# Patient Record
Sex: Female | Born: 2000
Health system: Southern US, Community
[De-identification: ages and names within clinical notes are randomized; demographics above are authoritative.]

## PROBLEM LIST (undated history)

## (undated) DIAGNOSIS — J302 Other seasonal allergic rhinitis: Secondary | ICD-10-CM

## (undated) DIAGNOSIS — E162 Hypoglycemia, unspecified: Secondary | ICD-10-CM

## (undated) HISTORY — PX: TONSILLECTOMY AND ADENOIDECTOMY: SUR1326

---
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
## 2022-04-11 ENCOUNTER — Ambulatory Visit (INDEPENDENT_AMBULATORY_CARE_PROVIDER_SITE_OTHER): Payer: No Typology Code available for payment source | Admitting: Physician Assistant

## 2022-04-11 ENCOUNTER — Encounter: Payer: Self-pay | Admitting: Physician Assistant

## 2022-04-11 VITALS — BP 120/83 | HR 83 | Ht 65.0 in | Wt 208.0 lb

## 2022-04-11 DIAGNOSIS — G44039 Episodic paroxysmal hemicrania, not intractable: Secondary | ICD-10-CM

## 2022-04-11 DIAGNOSIS — H532 Diplopia: Secondary | ICD-10-CM

## 2022-04-11 DIAGNOSIS — R5383 Other fatigue: Secondary | ICD-10-CM

## 2022-04-11 DIAGNOSIS — R519 Headache, unspecified: Secondary | ICD-10-CM | POA: Insufficient documentation

## 2022-04-11 DIAGNOSIS — Z23 Encounter for immunization: Secondary | ICD-10-CM | POA: Diagnosis not present

## 2022-04-11 DIAGNOSIS — H4710 Unspecified papilledema: Secondary | ICD-10-CM | POA: Diagnosis not present

## 2022-04-11 DIAGNOSIS — G932 Benign intracranial hypertension: Secondary | ICD-10-CM | POA: Diagnosis not present

## 2022-04-11 DIAGNOSIS — H9313 Tinnitus, bilateral: Secondary | ICD-10-CM | POA: Insufficient documentation

## 2022-04-11 LAB — CBC WITH DIFFERENTIAL/PLATELET
Basophils Absolute: 28 cells/uL (ref 0–200)
Eosinophils Absolute: 28 cells/uL (ref 15–500)
Eosinophils Relative: 0.4 %
HCT: 41 % (ref 35.0–45.0)
Lymphs Abs: 1746 cells/uL (ref 850–3900)
MPV: 9.8 fL (ref 7.5–12.5)
Neutro Abs: 4637 cells/uL (ref 1500–7800)
Neutrophils Relative %: 67.2 %
RBC: 4.62 10*6/uL (ref 3.80–5.10)
WBC: 6.9 10*3/uL (ref 3.8–10.8)

## 2022-04-11 MED ORDER — ACETAZOLAMIDE ER 500 MG PO CP12: 500.0000 mg | ORAL_CAPSULE | Freq: Two times a day (BID) | ORAL | 0 refills | Status: DC

## 2022-04-11 NOTE — Progress Notes (Signed)
New Patient Office Visit  Subjective    Patient ID: Alison Holden, female    DOB: April 26, 2001  Age: 21 y.o. MRN: [id number]  CC:  Chief Complaint  Patient presents with   Eye Pain    HPI Alison Holden presents to establish care. She is accompanied by her mother. 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 Swelling. She had had pulsatile tinnitus, frequent headaches since middle school. She has recently had some double vision intermittently. [facility] and MRI were ordered but patient never heard anything. She is here today to get MRI reordered. No new symptoms. She has hx of fatigue and taking iron supplements. She does admit to being tired a lot.   Mother does not want patient to have to have LP. She is interested in trial of diuretic.  Outpatient Encounter Medications as of 04/11/2022  Medication Sig   acetaZOLAMIDE ER (DIAMOX) 500 MG capsule Take 1 capsule (500 mg total) by mouth 2 (two) times daily.   Ascorbic Acid (VITAMIN C) 100 MG tablet Take 100 mg by mouth daily.   ferrous sulfate 325 (65 FE) MG tablet Take 325 mg by mouth daily with breakfast.   magnesium 30 MG tablet Take 30 mg by mouth 2 (two) times daily.   dicyclomine (BENTYL) 20 MG tablet Take 1 tablet (20 mg total) by mouth 3 (three) times daily as needed for spasms. (Patient not taking: Reported on 04/11/2022)   No facility-administered encounter medications on file as of 04/11/2022.    Past Medical History:  Diagnosis Date   Hypoglycemia    Seasonal allergies     Past Surgical History:  Procedure Laterality Date   TONSILLECTOMY AND ADENOIDECTOMY      Family History  Problem Relation Age of Onset   Cancer Other        colon    Social History   Socioeconomic History   Marital status: Single    Spouse name: Not on file   Number of children: Not on file   Years of education: Not on file    Highest education level: Not on file  Occupational History   Not on file  Tobacco Use   Smoking status: Never   Smokeless tobacco: Never  Substance and Sexual Activity   Alcohol use: No   Drug use: No   Sexual activity: Not on file  Other Topics Concern   Not on file  Social History Narrative   Not on file   Social Determinants of Health   Financial Resource Strain: Not on file  Food Insecurity: Not on file  Transportation Needs: Not on file  Physical Activity: Not on file  Stress: Not on file  Social Connections: Not on file  Intimate Partner Violence: Not on file    ROS See HPI.      Objective     Physical Exam Vitals reviewed.  Constitutional:      Appearance: Normal appearance. She is obese.  HENT:     Head: Normocephalic.     Right Ear: Tympanic membrane and external ear normal. There is no impacted cerumen.     Left Ear: Tympanic membrane, ear canal and external ear normal. There is no impacted cerumen.     Nose: Nose normal. No congestion.     Mouth/Throat:     Mouth: Mucous membranes are moist.     Pharynx: No posterior oropharyngeal  erythema.  Eyes:     General:        Right eye: No discharge.        Left eye: No discharge.     Extraocular Movements: Extraocular movements intact.     Conjunctiva/sclera: Conjunctivae normal.     Pupils: Pupils are equal, round, and reactive to light.     Comments: Large optic disc, bilaterally.   Neck:     Vascular: No carotid bruit.  Cardiovascular:     Rate and Rhythm: Normal rate.  Pulmonary:     Effort: Pulmonary effort is normal.     Breath sounds: Normal breath sounds.  Musculoskeletal:     Cervical back: Normal range of motion and neck supple. No tenderness.     Right lower leg: No edema.     Left lower leg: No edema.  Lymphadenopathy:     Cervical: No cervical adenopathy.  Neurological:     General: No focal deficit present.     Mental Status: She is alert and oriented to person, place, and time.   Psychiatric:        Mood and Affect: Mood normal.       Assessment & Plan:  Marland KitchenMarland KitchenCerissa was seen today for eye pain.  Diagnoses and all orders for this visit:  IIH (idiopathic intracranial hypertension) -     acetaZOLAMIDE ER (DIAMOX) 500 MG capsule; Take 1 capsule (500 mg total) by mouth 2 (two) times daily. -     COMPLETE METABOLIC PANEL WITH GFR -     CBC w/Diff/Platelet -     Fe+TIBC+Fer -     TSH -     VITAMIN D 25 Hydroxy (Vit-D Deficiency, Fractures) -     B12 and Folate Panel -     MR MRV HEAD WO CM; Future  Optic disc edema -     acetaZOLAMIDE ER (DIAMOX) 500 MG capsule; Take 1 capsule (500 mg total) by mouth 2 (two) times daily. -     COMPLETE METABOLIC PANEL WITH GFR -     CBC w/Diff/Platelet -     Fe+TIBC+Fer -     TSH -     VITAMIN D 25 Hydroxy (Vit-D Deficiency, Fractures) -     B12 and Folate Panel -     MR MRV HEAD WO CM; Future  No energy -     COMPLETE METABOLIC PANEL WITH GFR -     CBC w/Diff/Platelet -     Fe+TIBC+Fer -     TSH -     VITAMIN D 25 Hydroxy (Vit-D Deficiency, Fractures) -     B12 and Folate Panel  Frequent headaches -     acetaZOLAMIDE ER (DIAMOX) 500 MG capsule; Take 1 capsule (500 mg total) by mouth 2 (two) times daily. -     COMPLETE METABOLIC PANEL WITH GFR -     CBC w/Diff/Platelet -     Fe+TIBC+Fer -     TSH -     VITAMIN D 25 Hydroxy (Vit-D Deficiency, Fractures) -     B12 and Folate Panel -     MR MRV HEAD WO CM; Future  Tinnitus of both ears -     acetaZOLAMIDE ER (DIAMOX) 500 MG capsule; Take 1 capsule (500 mg total) by mouth 2 (two) times daily. -     COMPLETE METABOLIC PANEL WITH GFR -     CBC w/Diff/Platelet -     Fe+TIBC+Fer -     TSH -  VITAMIN D 25 Hydroxy (Vit-D Deficiency, Fractures) -     B12 and Folate Panel -     MR MRV HEAD WO CM; Future  Episodic paroxysmal hemicrania, not intractable -     acetaZOLAMIDE ER (DIAMOX) 500 MG capsule; Take 1 capsule (500 mg total) by mouth 2 (two) times daily. -      MR BRAIN W WO CONTRAST; Future -     MR MRV HEAD WO CM; Future  Double vision -     acetaZOLAMIDE ER (DIAMOX) 500 MG capsule; Take 1 capsule (500 mg total) by mouth 2 (two) times daily. -     MR BRAIN W WO CONTRAST; Future -     MR MRV HEAD WO CM; Future  Need for Tdap vaccination -     Tdap vaccine greater than or equal to 7yo IM  Concern for IIH with headaches, pulsitile tinnitus, double vision, optic disc swelling MRI and MRV ordered Diamox was sent to pharmacy for trial Follow up in 1 month Screening labs were ordered today     Return in about 4 weeks (around 05/09/2022).   Tandy Gaw, PA-C

## 2022-04-11 NOTE — Patient Instructions (Signed)
Idiopathic Intracranial Hypertension  Idiopathic intracranial hypertension (IIH) is a condition that increases pressure around the brain. The fluid that surrounds the brain and spinal cord (cerebrospinal fluid, or CSF) increases and causes the pressure. Idiopathic means that the cause of this condition is not known. IIH affects the brain and spinal cord (neurological disorder). If this condition is not treated, it can cause vision loss or blindness. What are the causes? The cause of this condition is not known. What increases the risk? The following factors may make you more likely to develop this condition: Being very overweight (obese). Being a female between the ages of 63 and 70 years old, who has not gone through menopause. Taking certain medicines, such as birth control or steroids. What are the signs or symptoms? Symptoms of this condition include: Headaches. This is the most common symptom. Brief episodes of total blindness. Double vision, blurred vision, or poor side (peripheral) vision. Pain in the shoulders or neck. Nausea and vomiting. A sound like rushing water or a pulsing sound within the ears (pulsatile tinnitus), or ringing in the ears. How is this diagnosed? This condition may be diagnosed based on: Your symptoms and medical history. Imaging tests of the brain, such as: CT scan. MRI. Magnetic resonance venogram (MRV) to check the veins. Diagnostic lumbar puncture. This is a procedure to remove and examine a sample of cerebrospinal fluid. This procedure can determine whether too much fluid may be causing IIH. A thorough eye exam to check for swelling or nerve damage in the eyes. How is this treated? Treatment for this condition depends on the symptoms. The goal of treatment is to decrease the pressure around your brain. Common treatments include: Weight loss through healthy eating, salt restriction, and exercise, if you are overweight. Medicines to decrease the  production of spinal fluid and lower the pressure within your skull. Medicines to prevent or treat headaches. Other treatments may include: Surgery to place drains (shunts) in your brain for removing excess fluid. Lumbar puncture to remove excess cerebrospinal fluid. Follow these instructions at home: If you are overweight or obese, work with your health care provider to lose weight. Take over-the-counter and prescription medicines only as told by your health care provider. Ask your health care provider if the medicine prescribed to you requires you to avoid driving or using machinery. Do not use any products that contain nicotine or tobacco, such as cigarettes, e-cigarettes, and chewing tobacco. If you need help quitting, ask your health care provider. Keep all follow-up visits as told by your health care provider. This is important. Contact a health care provider if: You have changes in your vision, such as: Double vision. Blurred vision. Poor peripheral vision. Get help right away if: You have any of the following symptoms and they get worse or do not get better: Headaches. Nausea. Vomiting. Sudden trouble seeing. Summary Idiopathic intracranial hypertension (IIH) is a condition that increases pressure around the brain. The cause is not known (is idiopathic). The most common symptom of IIH is headaches. Vision changes, pain in the shoulders or neck, nausea, and vomiting may also occur. Treatment for this condition depends on your symptoms. The goal of treatment is to decrease the pressure around your brain. If you are overweight or obese, work with your health care provider to lose weight. Take over-the-counter and prescription medicines only as told by your health care provider. This information is not intended to replace advice given to you by your health care provider. Make sure you discuss  any questions you have with your health care provider. Document Revised: 10/05/2019 Document  Reviewed: 10/05/2019 Elsevier Patient Education  2023 Elsevier Inc.  

## 2022-04-12 ENCOUNTER — Encounter: Payer: Self-pay | Admitting: Physician Assistant

## 2022-04-12 DIAGNOSIS — E559 Vitamin D deficiency, unspecified: Secondary | ICD-10-CM | POA: Insufficient documentation

## 2022-04-12 DIAGNOSIS — D509 Iron deficiency anemia, unspecified: Secondary | ICD-10-CM | POA: Insufficient documentation

## 2022-04-12 LAB — COMPLETE METABOLIC PANEL WITH GFR
AG Ratio: 1.8 (calc) (ref 1.0–2.5)
ALT: 14 U/L (ref 6–29)
AST: 14 U/L (ref 10–30)
Albumin: 4.7 g/dL (ref 3.6–5.1)
Alkaline phosphatase (APISO): 90 U/L (ref 31–125)
BUN: 12 mg/dL (ref 7–25)
CO2: 26 mmol/L (ref 20–32)
Calcium: 9.8 mg/dL (ref 8.6–10.2)
Chloride: 104 mmol/L (ref 98–110)
Creat: 0.76 mg/dL (ref 0.50–0.96)
Globulin: 2.6 g/dL (calc) (ref 1.9–3.7)
Glucose, Bld: 80 mg/dL (ref 65–139)
Potassium: 4.2 mmol/L (ref 3.5–5.3)
Sodium: 140 mmol/L (ref 135–146)
Total Bilirubin: 0.4 mg/dL (ref 0.2–1.2)
Total Protein: 7.3 g/dL (ref 6.1–8.1)
eGFR: 114 mL/min/{1.73_m2} (ref >=60)

## 2022-04-12 LAB — CBC WITH DIFFERENTIAL/PLATELET
Absolute Monocytes: 462 cells/uL (ref 200–950)
Basophils Relative: 0.4 %
Hemoglobin: 13.6 g/dL (ref 11.7–15.5)
MCH: 29.4 pg (ref 27.0–33.0)
MCHC: 33.2 g/dL (ref 32.0–36.0)
MCV: 88.7 fL (ref 80.0–100.0)
Monocytes Relative: 6.7 %
Platelets: 248 10*3/uL (ref 140–400)
RDW: 12.9 % (ref 11.0–15.0)
Total Lymphocyte: 25.3 %

## 2022-04-12 LAB — IRON,TIBC AND FERRITIN PANEL
%SAT: 17 % (calc) (ref 16–45)
Ferritin: 25 ng/mL (ref 16–154)
Iron: 67 ug/dL (ref 40–190)
TIBC: 398 mcg/dL (calc) (ref 250–450)

## 2022-04-12 LAB — B12 AND FOLATE PANEL
Folate: 16.5 ng/mL
Vitamin B-12: 336 pg/mL (ref 200–1100)

## 2022-04-12 LAB — VITAMIN D 25 HYDROXY (VIT D DEFICIENCY, FRACTURES): Vit D, 25-Hydroxy: 28 ng/mL — ABNORMAL LOW (ref 30–100)

## 2022-04-12 LAB — TSH: TSH: 1.92 mIU/L

## 2022-04-12 NOTE — Progress Notes (Signed)
[place],   Vitamin D is low. Make sure taking 2000 units of D3 a day.  Kidney, liver, glucose look great.  WBC and hemoglobin look great.  Thyroid looks great.   Iron looks ok.  Iron stores a little low.  Continue iron supplement as is but take with vitamin C to help you better absorb and eat iron rich foods.

## 2022-04-30 ENCOUNTER — Ambulatory Visit (HOSPITAL_BASED_OUTPATIENT_CLINIC_OR_DEPARTMENT_OTHER): Payer: No Typology Code available for payment source

## 2022-04-30 ENCOUNTER — Ambulatory Visit (HOSPITAL_BASED_OUTPATIENT_CLINIC_OR_DEPARTMENT_OTHER)
Admission: RE | Admit: 2022-04-30 | Discharge: 2022-04-30 | Disposition: A | Discharge status: Home / Self Care | Payer: No Typology Code available for payment source | Source: Ambulatory Visit | Attending: Physician Assistant | Admitting: Physician Assistant

## 2022-04-30 DIAGNOSIS — H532 Diplopia: Secondary | ICD-10-CM

## 2022-04-30 DIAGNOSIS — G44039 Episodic paroxysmal hemicrania, not intractable: Secondary | ICD-10-CM | POA: Diagnosis present

## 2022-04-30 MED ORDER — GADOBUTROL 1 MMOL/ML IV SOLN
7.5000 mL | Freq: Once | INTRAVENOUS | Status: AC | PRN
  Administered 2022-04-30: 7.5 mL via INTRAVENOUS

## 2022-05-11 ENCOUNTER — Ambulatory Visit (INDEPENDENT_AMBULATORY_CARE_PROVIDER_SITE_OTHER): Payer: No Typology Code available for payment source | Admitting: Physician Assistant

## 2022-05-11 ENCOUNTER — Encounter: Payer: Self-pay | Admitting: Physician Assistant

## 2022-05-11 VITALS — BP 106/68 | HR 70 | Ht 65.0 in | Wt 210.0 lb

## 2022-05-11 DIAGNOSIS — H4710 Unspecified papilledema: Secondary | ICD-10-CM | POA: Diagnosis not present

## 2022-05-11 DIAGNOSIS — G44039 Episodic paroxysmal hemicrania, not intractable: Secondary | ICD-10-CM

## 2022-05-11 DIAGNOSIS — H9313 Tinnitus, bilateral: Secondary | ICD-10-CM | POA: Diagnosis not present

## 2022-05-11 MED ORDER — FLUTICASONE PROPIONATE 50 MCG/ACT NA SUSP: 2.0000 | Freq: Every day | NASAL | 0 refills | Status: AC

## 2022-05-11 NOTE — Progress Notes (Signed)
Established Patient Office Visit  Subjective   Patient ID: Alison Holden, female    DOB: 27-Jun-2001  Age: 21 y.o. MRN: [id number]  Chief Complaint  Patient presents with   Follow-up    HPI Pt is a 21 yo female who presents to the clinic to follow up on optic disc edema found on routine exam and accompanied by frequent headaches and tinnitus. The suspicion was for IIH. She had MRI of brain and no concerns or findings. No recent vision changes. Her tinnitus has been better for the last 2 weeks and not noticed it. Her headaches are around her cycle and respond to excedrin migraine. She would still like to start diuretic. She is accompanied by mother.   .. Active Ambulatory Problems    Diagnosis Date Noted   Annual physical exam 08/22/2016   Irritable bowel syndrome with diarrhea 08/22/2016   Double vision 04/11/2022   Episodic paroxysmal hemicrania, not intractable 04/11/2022   Tinnitus of both ears 04/11/2022   Frequent headaches 04/11/2022   No energy 04/11/2022   Optic disc edema 04/11/2022   IIH (idiopathic intracranial hypertension) 04/11/2022   IDA (iron deficiency anemia) 04/12/2022   Vitamin D insufficiency 04/12/2022   Resolved Ambulatory Problems    Diagnosis Date Noted   No Resolved Ambulatory Problems   Past Medical History:  Diagnosis Date   Hypoglycemia    Seasonal allergies      ROS See HPI.    Objective:     BP 106/68 Comment: left arm manually  Pulse 70   Ht 5\' 5"  (1.651 m)   Wt 210 lb (95.3 kg)   SpO2 98%   BMI 34.95 kg/m  BP Readings from Last 3 Encounters:  05/11/22 106/68  04/11/22 120/83  08/22/16 (!) 128/80 (96 %, Z = 1.75 /  93 %, Z = 1.48)*   *BP percentiles are based on the 2017 AAP Clinical Practice Guideline for girls      Physical Exam Constitutional:      Appearance: Normal appearance. She is obese.  HENT:     Head: Normocephalic.     Right Ear: Tympanic membrane normal.     Left Ear: Tympanic membrane normal.      Nose: Nose normal.     Mouth/Throat:     Mouth: Mucous membranes are moist.  Eyes:     Conjunctiva/sclera: Conjunctivae normal.  Cardiovascular:     Rate and Rhythm: Normal rate and regular rhythm.     Pulses: Normal pulses.     Heart sounds: Normal heart sounds.  Pulmonary:     Effort: Pulmonary effort is normal.     Breath sounds: Normal breath sounds.  Musculoskeletal:     Right lower leg: No edema.     Left lower leg: No edema.  Neurological:     General: No focal deficit present.     Mental Status: She is alert and oriented to person, place, and time.  Psychiatric:        Mood and Affect: Mood normal.         Assessment & Plan:  2018Marland KitchenAzya was seen today for follow-up.  Diagnoses and all orders for this visit:  Optic disc edema  Tinnitus of both ears -     fluticasone (FLONASE) 50 MCG/ACT nasal spray; Place 2 sprays into both nostrils daily.  Episodic paroxysmal hemicrania, not intractable    Pt will scheduled a CPE and Pap before she goes to school.   Consider adding anti-histamine and flonase for  tinnitus and any allergy symptoms  Headaches around cycle about 2 a month and respond to Excedrin migraine   Ok to start diamox 1 tablet every morning but needs to really watch her BP since on the low side.   Needs follow up with opthalmology to review MRI and recheck optic cdisc swelling.     Tandy Gaw, PA-C

## 2022-06-14 ENCOUNTER — Encounter: Payer: Self-pay | Admitting: Physician Assistant

## 2022-06-14 ENCOUNTER — Other Ambulatory Visit (HOSPITAL_COMMUNITY)
Admission: RE | Admit: 2022-06-14 | Discharge: 2022-06-14 | Disposition: A | Discharge status: Home / Self Care | Payer: No Typology Code available for payment source | Source: Ambulatory Visit | Attending: Physician Assistant | Admitting: Physician Assistant

## 2022-06-14 ENCOUNTER — Ambulatory Visit (INDEPENDENT_AMBULATORY_CARE_PROVIDER_SITE_OTHER): Payer: No Typology Code available for payment source | Admitting: Physician Assistant

## 2022-06-14 VITALS — BP 123/63 | HR 90 | Ht 65.0 in | Wt 212.0 lb

## 2022-06-14 DIAGNOSIS — Z0000 Encounter for general adult medical examination without abnormal findings: Secondary | ICD-10-CM | POA: Diagnosis not present

## 2022-06-14 DIAGNOSIS — Z124 Encounter for screening for malignant neoplasm of cervix: Secondary | ICD-10-CM

## 2022-06-14 DIAGNOSIS — E6609 Other obesity due to excess calories: Secondary | ICD-10-CM | POA: Insufficient documentation

## 2022-06-14 DIAGNOSIS — L680 Hirsutism: Secondary | ICD-10-CM

## 2022-06-14 DIAGNOSIS — R519 Headache, unspecified: Secondary | ICD-10-CM

## 2022-06-14 DIAGNOSIS — Z6835 Body mass index (BMI) 35.0-35.9, adult: Secondary | ICD-10-CM

## 2022-06-14 DIAGNOSIS — H4710 Unspecified papilledema: Secondary | ICD-10-CM

## 2022-06-14 MED ORDER — ACETAZOLAMIDE 125 MG PO TABS: 125.0000 mg | ORAL_TABLET | Freq: Two times a day (BID) | ORAL | 0 refills | Status: DC

## 2022-06-14 NOTE — Patient Instructions (Addendum)
Ok for trial of lower dose diuretic.  Get labs.    Health Maintenance, Female Adopting a healthy lifestyle and getting preventive care are important in promoting health and wellness. Ask your health care provider about: The right schedule for you to have regular tests and exams. Things you can do on your own to prevent diseases and keep yourself healthy. What should I know about diet, weight, and exercise? Eat a healthy diet  Eat a diet that includes plenty of vegetables, fruits, low-fat dairy products, and lean protein. Do not eat a lot of foods that are high in solid fats, added sugars, or sodium. Maintain a healthy weight Body mass index (BMI) is used to identify weight problems. It estimates body fat based on height and weight. Your health care provider can help determine your BMI and help you achieve or maintain a healthy weight. Get regular exercise Get regular exercise. This is one of the most important things you can do for your health. Most adults should: Exercise for at least 150 minutes each week. The exercise should increase your heart rate and make you sweat (moderate-intensity exercise). Do strengthening exercises at least twice a week. This is in addition to the moderate-intensity exercise. Spend less time sitting. Even light physical activity can be beneficial. Watch cholesterol and blood lipids Have your blood tested for lipids and cholesterol at 22 years of age, then have this test every 5 years. Have your cholesterol levels checked more often if: Your lipid or cholesterol levels are high. You are older than 22 years of age. You are at high risk for heart disease. What should I know about cancer screening? Depending on your health history and family history, you may need to have cancer screening at various ages. This may include screening for: Breast cancer. Cervical cancer. Colorectal cancer. Skin cancer. Lung cancer. What should I know about heart disease,  diabetes, and high blood pressure? Blood pressure and heart disease High blood pressure causes heart disease and increases the risk of stroke. This is more likely to develop in people who have high blood pressure readings or are overweight. Have your blood pressure checked: Every 3-5 years if you are 50-18 years of age. Every year if you are 51 years old or older. Diabetes Have regular diabetes screenings. This checks your fasting blood sugar level. Have the screening done: Once every three years after age 38 if you are at a normal weight and have a low risk for diabetes. More often and at a younger age if you are overweight or have a high risk for diabetes. What should I know about preventing infection? Hepatitis B If you have a higher risk for hepatitis B, you should be screened for this virus. Talk with your health care provider to find out if you are at risk for hepatitis B infection. Hepatitis C Testing is recommended for: Everyone born from [birth year] through 1966. Anyone with known risk factors for hepatitis C. Sexually transmitted infections (STIs) Get screened for STIs, including gonorrhea and chlamydia, if: You are sexually active and are younger than 22 years of age. You are older than 22 years of age and your health care provider tells you that you are at risk for this type of infection. Your sexual activity has changed since you were last screened, and you are at increased risk for chlamydia or gonorrhea. Ask your health care provider if you are at risk. Ask your health care provider about whether you are at high risk for HIV.  Your health care provider may recommend a prescription medicine to help prevent HIV infection. If you choose to take medicine to prevent HIV, you should first get tested for HIV. You should then be tested every 3 months for as long as you are taking the medicine. Pregnancy If you are about to stop having your period (premenopausal) and you may become pregnant,  seek counseling before you get pregnant. Take 400 to 800 micrograms (mcg) of folic acid every day if you become pregnant. Ask for birth control (contraception) if you want to prevent pregnancy. Osteoporosis and menopause Osteoporosis is a disease in which the bones lose minerals and strength with aging. This can result in bone fractures. If you are 25 years old or older, or if you are at risk for osteoporosis and fractures, ask your health care provider if you should: Be screened for bone loss. Take a calcium or vitamin D supplement to lower your risk of fractures. Be given hormone replacement therapy (HRT) to treat symptoms of menopause. Follow these instructions at home: Alcohol use Do not drink alcohol if: Your health care provider tells you not to drink. You are pregnant, may be pregnant, or are planning to become pregnant. If you drink alcohol: Limit how much you have to: 0-1 drink a day. Know how much alcohol is in your drink. In the [place] one drink equals one 12 oz bottle of beer (355 mL), one 5 oz glass of wine (148 mL), or one 1 oz glass of hard liquor (44 mL). Lifestyle Do not use any products that contain nicotine or tobacco. These products include cigarettes, chewing tobacco, and vaping devices, such as e-cigarettes. If you need help quitting, ask your health care provider. Do not use street drugs. Do not share needles. Ask your health care provider for help if you need support or information about quitting drugs. General instructions Schedule regular health, dental, and eye exams. Stay current with your vaccines. Tell your health care provider if: You often feel depressed. You have ever been abused or do not feel safe at home. Summary Adopting a healthy lifestyle and getting preventive care are important in promoting health and wellness. Follow your health care provider's instructions about healthy diet, exercising, and getting tested or screened for diseases. Follow your  health care provider's instructions on monitoring your cholesterol and blood pressure. This information is not intended to replace advice given to you by your health care provider. Make sure you discuss any questions you have with your health care provider. Document Revised: 03/15/2021 Document Reviewed: 03/15/2021 Elsevier Patient Education  [place].

## 2022-06-14 NOTE — Progress Notes (Signed)
Complete physical exam  Patient: Alison Holden   DOB: August 15, 2001   20 y.o. Female  MRN: [id number]  Subjective:    Chief Complaint  Patient presents with   Annual Exam    Gracilyn Gunia is a 21 y.o. female who presents today for a complete physical exam. She reports consuming a general diet. The patient does not participate in regular exercise at present. She generally feels well. She reports sleeping well. She does not have additional problems to discuss today.     Most recent depression screenings:    04/11/2022    9:04 AM  PHQ 2/9 Scores  PHQ - 2 Score 0  PHQ- 9 Score 0    Vision:Within last year and Dental: No current dental problems and Current dental problems  Patient Active Problem List   Diagnosis Date Noted   Hirsutism 06/14/2022   Class 2 obesity due to excess calories without serious comorbidity with body mass index (BMI) of 35.0 to 35.9 in adult 06/14/2022   IDA (iron deficiency anemia) 04/12/2022   Vitamin D insufficiency 04/12/2022   Double vision 04/11/2022   Episodic paroxysmal hemicrania, not intractable 04/11/2022   Tinnitus of both ears 04/11/2022   Frequent headaches 04/11/2022   No energy 04/11/2022   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   ferrous sulfate 325 (65 FE) MG tablet Take 325 mg by mouth daily with breakfast.   fluticasone (FLONASE) 50 MCG/ACT nasal spray Place 2 sprays into both nostrils daily.   Magnesium  250 MG TABS Take 250 mg by mouth daily.   [DISCONTINUED] acetaZOLAMIDE ER (DIAMOX) 500 MG capsule Take 1 capsule (500 mg total) by mouth 2 (two) times daily. (Patient not taking: Reported on 05/11/2022)   No facility-administered medications prior to visit.    Review of Systems  All other systems reviewed and are negative.         Objective:     BP 123/63   Pulse 90   Ht 5\' 5"  (1.651 m)   Wt 212 lb (96.2 kg)   SpO2 98%   BMI 35.28 kg/m  BP Readings from Last 3 Encounters:  06/14/22 123/63  05/11/22 106/68  04/11/22 120/83   Wt Readings from Last 3 Encounters:  06/14/22 212 lb (96.2 kg)  05/11/22 210 lb (95.3 kg)  04/11/22 208 lb (94.3 kg)      Physical Exam  BP 123/63   Pulse 90   Ht 5\' 5"  (1.651 m)   Wt 212 lb (96.2 kg)   SpO2 98%   BMI 35.28 kg/m   General Appearance:    Alert, cooperative, no distress, appears stated age  Head:    Normocephalic, without obvious abnormality, atraumatic  Eyes:    PERRL, conjunctiva/corneas clear, EOM's intact, fundi    benign, both eyes  Ears:    Normal TM's and external ear canals, both ears  Nose:  Nares normal, septum midline, mucosa normal, no drainage    or sinus tenderness  Throat:   Lips, mucosa, and tongue normal; teeth and gums normal  Neck:   Supple, symmetrical, trachea midline, no adenopathy;    thyroid:  no enlargement/tenderness/nodules; no carotid   bruit or JVD  Back:     Symmetric, no curvature, ROM normal, no CVA tenderness  Lungs:     Clear to auscultation bilaterally, respirations unlabored  Chest Wall:    No tenderness or deformity   Heart:    Regular rate and rhythm, S1 and S2 normal, no murmur, rub   or gallop  Breast Exam:    No tenderness, masses, or nipple abnormality  Abdomen:     Soft, non-tender, bowel sounds active all four quadrants,    no masses, no organomegaly  Genitalia:    No adnexal tenderness. Discomfort with spectulum and bimanuel exam     Extremities:   Extremities normal,  atraumatic, no cyanosis or edema  Pulses:   2+ and symmetric all extremities  Skin:   Skin color, texture, turgor normal, no rashes or lesions Hair growth on breast and umbilical area  Lymph nodes:   Cervical, supraclavicular, and axillary nodes normal  Neurologic:   CNII-XII intact, normal strength, sensation and reflexes    throughout      Assessment & Plan:    Routine Health Maintenance and Physical Exam  Immunization History  Administered Date(s) Administered   Meningococcal Conjugate 01/22/2018   Tdap 04/11/2022    Health Maintenance  Topic Date Due   PAP SMEAR-Modifier  Never done   INFLUENZA VACCINE  06/07/2022   COVID-19 Vaccine (1) 06/30/2022 (Originally 04/25/2001)   PAP-Cervical Cytology Screening  04/12/2023 (Originally 10/25/2021)   HPV VACCINES (1 - 2-dose series) 04/12/2023 (Originally 10/26/2011)   Hepatitis C Screening  04/12/2023 (Originally 10/25/2018)   HIV Screening  04/12/2023 (Originally 10/26/2015)   TETANUS/TDAP  04/11/2032    Discussed health benefits of physical activity, and encouraged her to engage in regular exercise appropriate for her age and condition.  .. Discussed 150 minutes of exercise a week.  Encouraged vitamin D 1000 units and Calcium 1300mg  or 4 servings of dairy a day.  PHQ no concerns Lipid and hormones ordered for PCOS evaluation Pap done today Pt is not sexually active Discussed vaginismus, HO given Ok to try lower diamox dosage to see if drops BP less  MRI normal Follow up with opthalmology is 1 month for optic disc check Follow up in 1 year     , PA-C

## 2022-06-15 LAB — CYTOLOGY - PAP
Adequacy: ABSENT
Comment: NEGATIVE
Diagnosis: NEGATIVE
High risk HPV: NEGATIVE

## 2022-06-15 NOTE — Progress Notes (Signed)
Negative for HPV. No abnormal cells. Next pap in 3 years.

## 2022-06-22 NOTE — Progress Notes (Signed)
Still waiting for your testosterone.   Prolactin looked good.  Your DHEA was elevated which can be elevated in cases of PCOS.   Cholesterol looks good.  HDL a little low but weight loss and exercise could get this higher and more protective.

## 2022-06-25 LAB — PROLACTIN: Prolactin: 13.1 ng/mL

## 2022-06-25 LAB — LIPID PANEL W/REFLEX DIRECT LDL
Cholesterol: 128 mg/dL (ref <200)
HDL: 43 mg/dL — ABNORMAL LOW (ref >=50)
LDL Cholesterol (Calc): 76 mg/dL (calc)
Non-HDL Cholesterol (Calc): 85 mg/dL (calc) (ref <130)
Total CHOL/HDL Ratio: 3 (calc) (ref <5.0)
Triglycerides: 30 mg/dL (ref <150)

## 2022-06-25 LAB — CP TESTOSTERONE, BIO-FEMALE/CHILDREN
Albumin: 4.4 g/dL (ref 3.6–5.1)
Sex Hormone Binding: 39.1 nmol/L (ref 17–124)
TESTOSTERONE, BIOAVAILABLE: 10.8 ng/dL — ABNORMAL HIGH (ref 0.5–8.5)
Testosterone, Free: 5.4 pg/mL — ABNORMAL HIGH (ref 0.2–5.0)
Testosterone, Total, LC-MS-MS: 52 ng/dL — ABNORMAL HIGH (ref 2–45)

## 2022-06-25 LAB — DHEA-SULFATE: DHEA-SO4: 476 ug/dL — ABNORMAL HIGH (ref 44–286)

## 2022-06-27 ENCOUNTER — Encounter: Payer: Self-pay | Admitting: Physician Assistant

## 2022-06-27 NOTE — Progress Notes (Signed)
Testosterone and DHEA are up and diagnostic for PCOS.   The best way to control and manage PCOS is keeping a healthy weight.  At times starting birth control can also help with the hormonal management and adding metformin can help with the insulin resistance. We could do a virtual and discuss more and see if you would like any medication treatment?

## 2022-06-28 ENCOUNTER — Encounter: Payer: Self-pay | Admitting: Physician Assistant

## 2022-06-28 MED ORDER — METFORMIN HCL ER 500 MG PO TB24: 500.0000 mg | ORAL_TABLET | Freq: Every day | ORAL | 3 refills | Status: DC

## 2022-06-28 NOTE — Addendum Note (Signed)
Addended by: Jomarie Longs on: 06/28/2022 07:28 AM   Modules accepted: Orders

## 2022-07-01 MED ORDER — NORETHINDRONE ACET-ETHINYL EST 1-20 MG-MCG PO TABS: 1.0000 | ORAL_TABLET | Freq: Every day | ORAL | 3 refills | Status: DC

## 2022-08-10 ENCOUNTER — Ambulatory Visit: Payer: No Typology Code available for payment source | Admitting: Physician Assistant

## 2023-01-16 ENCOUNTER — Encounter: Payer: Self-pay | Admitting: Physician Assistant

## 2023-01-16 ENCOUNTER — Other Ambulatory Visit: Payer: Self-pay | Admitting: Physician Assistant

## 2023-01-16 ENCOUNTER — Ambulatory Visit (INDEPENDENT_AMBULATORY_CARE_PROVIDER_SITE_OTHER): Payer: No Typology Code available for payment source | Admitting: Physician Assistant

## 2023-01-16 VITALS — BP 108/64 | HR 88 | Temp 97.9°F | Ht 65.0 in | Wt 210.0 lb

## 2023-01-16 DIAGNOSIS — J0110 Acute frontal sinusitis, unspecified: Secondary | ICD-10-CM

## 2023-01-16 DIAGNOSIS — Z6834 Body mass index (BMI) 34.0-34.9, adult: Secondary | ICD-10-CM

## 2023-01-16 DIAGNOSIS — E282 Polycystic ovarian syndrome: Secondary | ICD-10-CM

## 2023-01-16 DIAGNOSIS — E6609 Other obesity due to excess calories: Secondary | ICD-10-CM | POA: Diagnosis not present

## 2023-01-16 MED ORDER — AMOXICILLIN-POT CLAVULANATE 875-125 MG PO TABS: 1.0000 | ORAL_TABLET | Freq: Two times a day (BID) | ORAL | 0 refills | Status: DC

## 2023-01-16 MED ORDER — METFORMIN HCL ER (MOD) 1000 MG PO TB24: 1000.0000 mg | ORAL_TABLET | Freq: Every day | ORAL | 3 refills | Status: DC

## 2023-01-16 NOTE — Patient Instructions (Signed)

## 2023-01-16 NOTE — Progress Notes (Signed)
Established Patient Office Visit  Subjective   Patient ID: Alison Holden, female    DOB: 02-01-2001  Age: 22 y.o. MRN: [id number]  Chief Complaint  Patient presents with   Follow-up    HPI Pt is a 22 year old female who presents to the clinic with 3 weeks of upper respiratory infection symptoms.  She has been to her [facility] and tested for COVID, strep, flu, RSV and all were negative.  She has used over-the-counter medications such as Tylenol Cold and sinus and Mucinex with some mild relief.  She continues to have a lot of head pressure, ear fullness, sore throat, cough.  She is blowing out green productive sputum.  She denies any fever, chills, body aches, shortness of breath.  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  She has done well on metformin with no side effects.  She does feel like it has helped her lose some weight.  She would like to increase this dose.  She is actively going to taekwondo and managing her diet.  .. 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     Objective:     BP 108/64    Pulse 88   Temp 97.9 F (36.6 C) (Oral)   Ht '5\' 5"'$  (1.651 m)   Wt 210 lb (95.3 kg)   SpO2 97%   BMI 34.95 kg/m  BP Readings from Last 3 Encounters:  01/16/23 108/64  06/14/22 123/63  05/11/22 106/68   Wt Readings from Last 3 Encounters:  01/16/23 210 lb (95.3 kg)  06/14/22 212 lb (96.2 kg)  05/11/22 210 lb (95.3 kg)      Physical Exam Constitutional:      Appearance: Normal appearance. She is obese.  HENT:     Head: Normocephalic.     Right Ear: Tympanic membrane, ear canal and external ear normal. There is no impacted cerumen.     Left Ear: Tympanic membrane, ear canal and external ear normal. There is no impacted cerumen.     Ears:     Comments: Tenderness to palpation over maxillary sinuses     Nose: Congestion present.     Mouth/Throat:     Mouth: Mucous membranes are moist.     Pharynx: Posterior oropharyngeal erythema present. No oropharyngeal exudate.  Eyes:     Extraocular Movements: Extraocular movements intact.     Conjunctiva/sclera: Conjunctivae normal.     Pupils: Pupils are equal, round, and reactive to light.  Neck:     Vascular: No carotid bruit.  Cardiovascular:     Rate and Rhythm: Normal rate and  regular rhythm.  Pulmonary:     Effort: Pulmonary effort is normal.     Breath sounds: Normal breath sounds.  Musculoskeletal:     Cervical back: Neck supple.     Right lower leg: No edema.     Left lower leg: No edema.  Lymphadenopathy:     Cervical: No cervical adenopathy.  Neurological:     General: No focal deficit present.     Mental Status: She is alert.  Psychiatric:        Mood and Affect: Mood normal.       Assessment & Plan:  Marland KitchenMarland KitchenShilyn was seen today for follow-up.  Diagnoses and all orders for this visit:  Acute non-recurrent frontal sinusitis -     amoxicillin-clavulanate (AUGMENTIN) 875-125 MG tablet; Take 1 tablet by mouth 2 (two) times daily.  PCOS (polycystic ovarian syndrome) -     metFORMIN (GLUMETZA) 1000 MG (MOD)  24 hr tablet; Take 1 tablet (1,000 mg total) by mouth daily with breakfast.  Class 1 obesity due to excess calories without serious comorbidity with body mass index (BMI) of 34.0 to 34.9 in adult -     metFORMIN (GLUMETZA) 1000 MG (MOD) 24 hr tablet; Take 1 tablet (1,000 mg total) by mouth daily with breakfast.   3 weeks of viral URI suspect secondary respiratory infection start Augmentin for 10 days Follow up as needed or if symptoms persist or worsen  Increased metformin to '1000mg'$  ER in the morning Continue with exercise at least 150 minutes a week Consider IF 16 to 8.    Iran Planas, PA-C

## 2023-01-17 MED ORDER — METFORMIN HCL ER 500 MG PO TB24: 1000.0000 mg | ORAL_TABLET | Freq: Every day | ORAL | 3 refills | Status: DC

## 2023-04-10 ENCOUNTER — Other Ambulatory Visit: Payer: Self-pay | Admitting: Physician Assistant

## 2023-04-10 DIAGNOSIS — G44039 Episodic paroxysmal hemicrania, not intractable: Secondary | ICD-10-CM

## 2023-04-10 DIAGNOSIS — G932 Benign intracranial hypertension: Secondary | ICD-10-CM

## 2023-04-10 DIAGNOSIS — H532 Diplopia: Secondary | ICD-10-CM

## 2023-04-10 DIAGNOSIS — H9313 Tinnitus, bilateral: Secondary | ICD-10-CM

## 2023-04-10 DIAGNOSIS — R519 Headache, unspecified: Secondary | ICD-10-CM

## 2023-04-10 DIAGNOSIS — H4710 Unspecified papilledema: Secondary | ICD-10-CM

## 2023-08-02 ENCOUNTER — Telehealth: Payer: Self-pay | Admitting: *Deleted

## 2023-08-02 NOTE — Telephone Encounter (Signed)
Calling pt to schedule follow up per LOV in 06/2022.  Pt mother said pt would be available either 10/10 or 10/11.  PCP not available those days.  Pts mother will call back to schedule when pt gets out of class. Please assist in getting this scheduled.

## 2023-11-16 ENCOUNTER — Ambulatory Visit: Payer: No Typology Code available for payment source | Admitting: Family Medicine

## 2023-11-17 ENCOUNTER — Encounter: Payer: Self-pay | Admitting: Physician Assistant

## 2023-11-17 ENCOUNTER — Ambulatory Visit (INDEPENDENT_AMBULATORY_CARE_PROVIDER_SITE_OTHER): Payer: No Typology Code available for payment source | Admitting: Physician Assistant

## 2023-11-17 VITALS — BP 138/82 | HR 85 | Ht 65.0 in | Wt 185.0 lb

## 2023-11-17 DIAGNOSIS — Z6830 Body mass index (BMI) 30.0-30.9, adult: Secondary | ICD-10-CM

## 2023-11-17 DIAGNOSIS — R5383 Other fatigue: Secondary | ICD-10-CM

## 2023-11-17 DIAGNOSIS — E6609 Other obesity due to excess calories: Secondary | ICD-10-CM

## 2023-11-17 DIAGNOSIS — E282 Polycystic ovarian syndrome: Secondary | ICD-10-CM | POA: Diagnosis not present

## 2023-11-17 DIAGNOSIS — E559 Vitamin D deficiency, unspecified: Secondary | ICD-10-CM

## 2023-11-17 DIAGNOSIS — R110 Nausea: Secondary | ICD-10-CM

## 2023-11-17 DIAGNOSIS — D508 Other iron deficiency anemias: Secondary | ICD-10-CM | POA: Diagnosis not present

## 2023-11-17 DIAGNOSIS — E66811 Obesity, class 1: Secondary | ICD-10-CM

## 2023-11-17 MED ORDER — METFORMIN HCL ER 500 MG PO TB24: 1000.0000 mg | ORAL_TABLET | Freq: Every day | ORAL | 3 refills | Status: DC

## 2023-11-17 MED ORDER — ONDANSETRON 8 MG PO TBDP: 8.0000 mg | ORAL_TABLET | Freq: Three times a day (TID) | ORAL | 1 refills | Status: AC | PRN

## 2023-11-17 NOTE — Patient Instructions (Signed)
Nausea, Adult Nausea is feeling like you may vomit. Feeling like you may vomit is usually not serious, but it may be an early sign of a more serious medical problem. Vomiting is when stomach contents forcefully come out of your mouth. If you vomit, or if you are not able to drink enough fluids, you may not have enough water in your body (get dehydrated). If you do not have enough water in your body, you may: Feel tired. Feel thirsty. Have a dry mouth. Have cracked lips. Pee (urinate) less often. Older adults and people who have other diseases or a weak body defense system (immune system) have a higher risk of not having enough water in the body. The main goals of treating this condition are: To relieve your nausea. To ensure your nausea occurs less often. To prevent vomiting and losing too much fluid. Follow these instructions at home: Watch your symptoms for any changes. Tell your doctor about them. Eating and drinking     Take an ORS (oral rehydration solution). This is a drink that is sold at pharmacies and stores. Drink clear fluids in small amounts as you are able. These include: Water. Ice chips. Fruit juice that has water added (diluted fruit juice). Low-calorie sports drinks. Eat bland, easy-to-digest foods in small amounts as you are able, such as: Bananas. Applesauce. Rice. Low-fat (lean) meats. Toast. Crackers. Avoid drinking fluids that have a lot of sugar or caffeine in them. This includes energy drinks, sports drinks, and soda. Avoid alcohol. Avoid spicy or fatty foods. General instructions Take over-the-counter and prescription medicines only as told by your doctor. Rest at home while you get better. Drink enough fluid to keep your pee (urine) pale yellow. Take slow and deep breaths when you feel like you may vomit. Avoid food or things that have strong smells. Wash your hands often with soap and water for at least 20 seconds. If you cannot use soap and water,  use hand sanitizer. Make sure that everyone in your home washes their hands well and often. Keep all follow-up visits. Contact a doctor if: You feel worse. You feel like you may vomit and this lasts for more than 2 days. You vomit. You are not able to drink fluids without vomiting. You have new symptoms. You have a fever. You have a headache. You have muscle cramps. You have a rash. You have pain while peeing. You feel light-headed or dizzy. Get help right away if: You have pain in your chest, neck, arm, or jaw. You feel very weak or you faint. You have vomit that is bright red or looks like coffee grounds. You have bloody or black poop (stools) or poop that looks like tar. You have a very bad headache, a stiff neck, or both. You have very bad pain, cramping, or bloating in your belly (abdomen). You have trouble breathing or you are breathing very quickly. Your heart is beating very quickly. Your skin feels cold and clammy. You feel confused. You have signs of losing too much water in your body, such as: Dark pee, very little pee, or no pee. Cracked lips. Dry mouth. Sunken eyes. Sleepiness. Weakness. These symptoms may be an emergency. Get help right away. Call 911. Do not wait to see if the symptoms will go away. Do not drive yourself to the hospital. Summary Nausea is feeling like you are about vomit. If you vomit, or if you are not able to drink enough fluids, you may not have enough water in   your body (get dehydrated). Eat and drink what your doctor tells you. Take over-the-counter and prescription medicines only as told by your doctor. Contact a doctor right away if your symptoms get worse or you have new symptoms. Keep all follow-up visits. This information is not intended to replace advice given to you by your health care provider. Make sure you discuss any questions you have with your health care provider. Document Revised: 04/30/2021 Document Reviewed:  04/30/2021 Elsevier Patient Education  2024 [employer]  

## 2023-11-17 NOTE — Progress Notes (Signed)
 Established Patient Office Visit  Subjective   Patient ID: Alison Holden, female    DOB: 2001-05-31  Age: 23 y.o. MRN: [id number]  Chief Complaint  Patient presents with   Medical Management of Chronic Issues    Med refill Nausea with metformin  pt reports taking 1,000 mg once daily,pt want to know if its time for labs    HPI Pt is 23 yo female with PCOS, IDA, vitamin D  deficiency who presents to the clinic for follow up.   She is on metformin  2 tablets in the morning for the last 9 months and doing GREAT. She has lost 25lbs. She denies any CP, palpitations, headaches, vision changes. She is exercising and eating better.   She has been nauseated in the morning for the last week with some dry mouth. Denies any fever, chills, SOB, cough, vomiting, diarrhea, constipation or abdominal pain. No reflux issues. No urinary symptoms.  Not taking any new medications or supplements. She is not sexually active.   .. 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 Negative for chills, diaphoresis, fever and weight  loss.  HENT: Negative.    Eyes: Negative.   Respiratory: Negative.    Cardiovascular: Negative.   Gastrointestinal:  Positive for nausea. Negative for abdominal pain, constipation, diarrhea, heartburn and vomiting.  Genitourinary: Negative.   Skin: Negative.  Negative for rash.  Neurological: Negative.   Psychiatric/Behavioral: Negative.        Objective:     BP 138/82   Pulse 85   Ht 5' 5 (1.651 m)   Wt 185 lb (83.9 kg)   SpO2 99%   BMI 30.79 kg/m  BP Readings from Last 3 Encounters:  11/17/23 138/82  01/16/23 108/64  06/14/22 123/63   Wt Readings from Last 3 Encounters:  11/17/23 185 lb (83.9 kg)  01/16/23 210 lb (95.3 kg)  06/14/22 212 lb (96.2 kg)      Physical Exam Constitutional:      Appearance: Normal appearance.  HENT:     Head: Normocephalic.  Cardiovascular:     Rate and Rhythm: Normal rate and regular rhythm.  Pulmonary:     Effort: Pulmonary effort is normal.     Breath sounds: Normal breath sounds.  Abdominal:     General: There is no distension.     Palpations: Abdomen is soft. There is no mass.     Tenderness: There is no abdominal tenderness. There is no guarding or rebound.     Hernia: No hernia is present.  Musculoskeletal:     Cervical back: Normal range of motion and neck supple. No tenderness.  Lymphadenopathy:     Cervical: No cervical adenopathy.  Neurological:     General: No focal deficit present.     Mental Status: She is alert and oriented to person, place, and time.  Psychiatric:        Mood and Affect: Mood normal.          Assessment & Plan:  SABRASABRASaleena was seen today for medical management of chronic issues.  Diagnoses and all orders for this visit:  PCOS (polycystic ovarian syndrome) -     Testosterone  -     VITAMIN D  25 Hydroxy (Vit-D Deficiency, Fractures) -     TSH + free T4 -     Fe+TIBC+Fer -     CBC w/Diff/Platelet -     CMP14+EGFR -     B12 and Folate Panel -     Lipid panel -     metFORMIN   (GLUCOPHAGE -XR) 500 MG 24 hr tablet; Take 2 tablets (1,000 mg total) by mouth daily with breakfast.  Class 1 obesity due to excess calories without serious comorbidity with body mass index (BMI) of 30.0 to 30.9 in adult -     Testosterone  -     VITAMIN D  25 Hydroxy (Vit-D Deficiency, Fractures) -     TSH + free T4 -     Fe+TIBC+Fer -     CBC w/Diff/Platelet -     CMP14+EGFR -     B12 and Folate Panel -     Lipid panel -     metFORMIN  (GLUCOPHAGE -XR) 500 MG 24 hr tablet; Take 2 tablets (1,000 mg total) by mouth daily with breakfast.  Iron deficiency anemia secondary to inadequate dietary iron intake -     Fe+TIBC+Fer -     CBC w/Diff/Platelet  No energy -     Testosterone  -     VITAMIN D  25 Hydroxy (Vit-D Deficiency, Fractures) -     TSH + free T4 -     Fe+TIBC+Fer -     CBC w/Diff/Platelet -     CMP14+EGFR -     B12 and Folate Panel -     Lipid panel  Vitamin D  insufficiency -     VITAMIN D  25 Hydroxy (Vit-D Deficiency, Fractures)  Nausea -     ondansetron  (ZOFRAN -ODT) 8 MG disintegrating tablet; Take 1 tablet (8 mg total) by mouth every 8 (eight) hours as needed.   Recheck fasting labs today Refilled metformin  Follow up in 1 year  Vitals look good GREAT news on weight loss  No cause for nausea Not sexually active No red flags ? Viral or dehydration Zofran  as needed given Follow up if does not resolve or worsen Do not think medication related no recent new meds or changes   Return in about 1 year (around 11/16/2024), or if symptoms worsen or fail to improve.    Avis Tirone, PA-C

## 2023-11-20 NOTE — Progress Notes (Signed)
 Vitamin d  looks better. Continue to take regularly.  Thyroid looks good.  Iron and hemoglobin look good. Kidney, liver, glucose look great.  Cholesterol looks good.  Testosterone  still on the high side. We could start spironolactone  to see if it helps combat some of effects of higher testosterone . Thoughts?

## 2023-11-21 LAB — LIPID PANEL
Chol/HDL Ratio: 3 {ratio} (ref 0.0–4.4)
Cholesterol, Total: 139 mg/dL (ref 100–199)
HDL: 46 mg/dL (ref >39)
LDL Chol Calc (NIH): 83 mg/dL (ref 0–99)
Triglycerides: 43 mg/dL (ref 0–149)
VLDL Cholesterol Cal: 10 mg/dL (ref 5–40)

## 2023-11-21 LAB — TSH+FREE T4
Free T4: 1.31 ng/dL (ref 0.82–1.77)
TSH: 1.36 u[IU]/mL (ref 0.450–4.500)

## 2023-11-21 LAB — CMP14+EGFR
ALT: 10 [IU]/L (ref 0–32)
AST: 14 [IU]/L (ref 0–40)
Albumin: 4.7 g/dL (ref 4.0–5.0)
Alkaline Phosphatase: 84 [IU]/L (ref 44–121)
BUN/Creatinine Ratio: 15 (ref 9–23)
BUN: 12 mg/dL (ref 6–20)
Bilirubin Total: 0.4 mg/dL (ref 0.0–1.2)
CO2: 20 mmol/L (ref 20–29)
Calcium: 9.6 mg/dL (ref 8.7–10.2)
Chloride: 100 mmol/L (ref 96–106)
Creatinine, Ser: 0.81 mg/dL (ref 0.57–1.00)
Globulin, Total: 2.2 g/dL (ref 1.5–4.5)
Glucose: 75 mg/dL (ref 70–99)
Potassium: 4.4 mmol/L (ref 3.5–5.2)
Sodium: 138 mmol/L (ref 134–144)
Total Protein: 6.9 g/dL (ref 6.0–8.5)
eGFR: 105 mL/min/{1.73_m2} (ref >59)

## 2023-11-21 LAB — CBC WITH DIFFERENTIAL/PLATELET
Basophils Absolute: 0 10*3/uL (ref 0.0–0.2)
Basos: 0 %
EOS (ABSOLUTE): 0 10*3/uL (ref 0.0–0.4)
Eos: 0 %
Hematocrit: 42.5 % (ref 34.0–46.6)
Hemoglobin: 14.1 g/dL (ref 11.1–15.9)
Immature Grans (Abs): 0 10*3/uL (ref 0.0–0.1)
Immature Granulocytes: 0 %
Lymphocytes Absolute: 1.9 10*3/uL (ref 0.7–3.1)
Lymphs: 27 %
MCH: 30 pg (ref 26.6–33.0)
MCHC: 33.2 g/dL (ref 31.5–35.7)
MCV: 90 fL (ref 79–97)
Monocytes Absolute: 0.4 10*3/uL (ref 0.1–0.9)
Monocytes: 6 %
Neutrophils Absolute: 4.7 10*3/uL (ref 1.4–7.0)
Neutrophils: 67 %
Platelets: 254 10*3/uL (ref 150–450)
RBC: 4.7 x10E6/uL (ref 3.77–5.28)
RDW: 12.7 % (ref 11.7–15.4)
WBC: 7.1 10*3/uL (ref 3.4–10.8)

## 2023-11-21 LAB — TESTOSTERONE: Testosterone: 76 ng/dL — ABNORMAL HIGH (ref 13–71)

## 2023-11-21 LAB — IRON,TIBC AND FERRITIN PANEL
Ferritin: 31 ng/mL (ref 15–150)
Iron Saturation: 30 % (ref 15–55)
Iron: 110 ug/dL (ref 27–159)
Total Iron Binding Capacity: 369 ug/dL (ref 250–450)
UIBC: 259 ug/dL (ref 131–425)

## 2023-11-21 LAB — VITAMIN D 25 HYDROXY (VIT D DEFICIENCY, FRACTURES): Vit D, 25-Hydroxy: 36.3 ng/mL (ref 30.0–100.0)

## 2023-11-21 LAB — B12 AND FOLATE PANEL
Folate: 5.8 ng/mL (ref >3.0)
Vitamin B-12: 411 pg/mL (ref 232–1245)

## 2023-11-30 ENCOUNTER — Encounter: Payer: Self-pay | Admitting: Physician Assistant

## 2023-11-30 ENCOUNTER — Telehealth: Payer: Self-pay

## 2023-11-30 DIAGNOSIS — L680 Hirsutism: Secondary | ICD-10-CM

## 2023-11-30 DIAGNOSIS — E66811 Obesity, class 1: Secondary | ICD-10-CM

## 2023-11-30 DIAGNOSIS — E282 Polycystic ovarian syndrome: Secondary | ICD-10-CM

## 2023-11-30 NOTE — Telephone Encounter (Signed)
Patient's mother called back and left message with Emelia Loron that  it is o.k. to spironolactone for patient.

## 2023-12-01 MED ORDER — SPIRONOLACTONE 25 MG PO TABS: 25.0000 mg | ORAL_TABLET | Freq: Every day | ORAL | 1 refills | Status: DC

## 2023-12-01 NOTE — Telephone Encounter (Signed)
Called patient mother Alison Holden. Left a detailed voice message on listed home # ( allowed on DPR )

## 2023-12-01 NOTE — Addendum Note (Signed)
Addended by: Jomarie Longs on: 12/01/2023 11:45 AM   Modules accepted: Orders

## 2024-11-11 ENCOUNTER — Ambulatory Visit: Admitting: Physician Assistant

## 2024-11-13 ENCOUNTER — Ambulatory Visit (INDEPENDENT_AMBULATORY_CARE_PROVIDER_SITE_OTHER): Admitting: Physician Assistant

## 2024-11-13 ENCOUNTER — Encounter: Payer: Self-pay | Admitting: Physician Assistant

## 2024-11-13 VITALS — BP 110/75 | HR 86 | Temp 98.0°F | Ht 66.0 in | Wt 182.0 lb

## 2024-11-13 DIAGNOSIS — J0140 Acute pansinusitis, unspecified: Secondary | ICD-10-CM | POA: Diagnosis not present

## 2024-11-13 DIAGNOSIS — Z131 Encounter for screening for diabetes mellitus: Secondary | ICD-10-CM | POA: Diagnosis not present

## 2024-11-13 DIAGNOSIS — Z13220 Encounter for screening for lipoid disorders: Secondary | ICD-10-CM | POA: Diagnosis not present

## 2024-11-13 DIAGNOSIS — E282 Polycystic ovarian syndrome: Secondary | ICD-10-CM

## 2024-11-13 DIAGNOSIS — E663 Overweight: Secondary | ICD-10-CM | POA: Insufficient documentation

## 2024-11-13 MED ORDER — FLUTICASONE PROPIONATE 50 MCG/ACT NA SUSP: 2.0000 | Freq: Every day | NASAL | 0 refills | Status: AC

## 2024-11-13 MED ORDER — METFORMIN HCL ER 500 MG PO TB24: 1000.0000 mg | ORAL_TABLET | Freq: Every day | ORAL | 3 refills | Status: AC

## 2024-11-13 MED ORDER — AMOXICILLIN-POT CLAVULANATE 875-125 MG PO TABS: 1.0000 | ORAL_TABLET | Freq: Two times a day (BID) | ORAL | 0 refills | Status: AC

## 2024-11-13 NOTE — Patient Instructions (Signed)

## 2024-11-13 NOTE — Progress Notes (Signed)
 "  Established Patient Office Visit  Subjective   Patient ID: Alison Holden, female    DOB: Jun 20, 2001  Age: 24 y.o. MRN: [id number]  HPI .SABRADiscussed the use of AI scribe software for clinical note transcription with the patient, who gave verbal consent to proceed.  History of Present Illness Alison Holden is a 24 year old female who presents with sinusitis symptoms. She is accompanied by her mother, who is on the phone during the visit.  Upper respiratory symptoms - Sinus congestion since December 14th, shortly after returning from school - Sore throat, fatigue, sweats, and body soreness developed after onset of congestion - Symptoms primarily located in the sinuses - Ear pain present - No dyspnea aside from sinus congestion - Backaches present - Suspected fever due to sweats, possibly masked by ibuprofen  use  Symptom management - Ibuprofen  used for symptom relief, possibly reducing fever - Sudafed provides some relief for sinus congestion  Polycystic ovary syndrome (pcos) and menstrual history - Currently taking metformin  1000 mg once daily for PCOS, due for refill - Metformin  is helping her with weight loss. She is down about 5lbs since last visit.  - Menstrual cycles are regular - Last menstrual period occurred a few days early, attributed to exam-related stress - Requests blood draw to check PCOS-related hormone levels, last tested one year ago    ROS See HPI.    Objective:     BP 110/75   Pulse 86   Temp 98 F (36.7 C) (Oral)   Ht 5' 6 (1.676 m)   Wt 182 lb (82.6 kg)   SpO2 98%   BMI 29.38 kg/m  BP Readings from Last 3 Encounters:  11/13/24 110/75  11/17/23 138/82  01/16/23 108/64   Wt Readings from Last 3 Encounters:  11/13/24 182 lb (82.6 kg)  11/17/23 185 lb (83.9 kg)  01/16/23 210 lb (95.3 kg)      Physical Exam Constitutional:      Appearance: Normal appearance.  HENT:     Head: Normocephalic.     Comments: Tenderness over  maxillary and frontal sinuses to palpation.     Right Ear: Ear canal and external ear normal. There is no impacted cerumen.     Left Ear: External ear normal. There is no impacted cerumen.     Ears:     Comments: TM's erythematous.     Mouth/Throat:     Mouth: Mucous membranes are moist.     Pharynx: Posterior oropharyngeal erythema present. No oropharyngeal exudate.  Eyes:     Conjunctiva/sclera: Conjunctivae normal.  Cardiovascular:     Rate and Rhythm: Normal rate and regular rhythm.  Pulmonary:     Effort: Pulmonary effort is normal.     Breath sounds: Normal breath sounds.  Musculoskeletal:     Cervical back: Normal range of motion and neck supple. Tenderness present.  Lymphadenopathy:     Cervical: Cervical adenopathy present.  Neurological:     Mental Status: She is alert.  Psychiatric:        Mood and Affect: Mood normal.          Assessment & Plan:  SABRASABRAJaimi was seen today for medical management of chronic issues.  Diagnoses and all orders for this visit:  Acute non-recurrent pansinusitis -     amoxicillin -clavulanate (AUGMENTIN ) 875-125 MG tablet; Take 1 tablet by mouth 2 (two) times daily. -     fluticasone  (FLONASE ) 50 MCG/ACT nasal spray; Place 2 sprays into both nostrils daily.  PCOS (polycystic  ovarian syndrome) -     metFORMIN  (GLUCOPHAGE -XR) 500 MG 24 hr tablet; Take 2 tablets (1,000 mg total) by mouth daily with breakfast. -     Testosterone  -     VITAMIN D  25 Hydroxy (Vit-D Deficiency, Fractures) -     TSH + free T4 -     Fe+TIBC+Fer -     CMP14+EGFR -     Lipid panel -     DHEA-sulfate -     Hemoglobin A1c  Class 1 obesity due to excess calories without serious comorbidity with body mass index (BMI) of 30.0 to 30.9 in adult -     metFORMIN  (GLUCOPHAGE -XR) 500 MG 24 hr tablet; Take 2 tablets (1,000 mg total) by mouth daily with breakfast. -     Testosterone  -     VITAMIN D  25 Hydroxy (Vit-D Deficiency, Fractures) -     TSH + free T4 -      Fe+TIBC+Fer -     CMP14+EGFR -     Lipid panel -     DHEA-sulfate -     Hemoglobin A1c  Screening for diabetes mellitus -     CMP14+EGFR  Screening for lipid disorders -     Lipid panel   Assessment & Plan Acute pansinusitis - Prescribed Augmentin  for sinusitis. - Recommended Flonase  for sinus swelling. - Advised hydration.  Polycystic ovarian syndrome PCOS managed with metformin . Blood work due for monitoring. No indication for GLP-1 agonists at this time but would help with weight management in the future if needed.  - Ordered blood work for PCOS monitoring. - Refilled metformin  prescription.  Hx of obesity Losing weight. Overweight at this time with BMI of 29. Engaging in intermittent fasting.  - Continue current weight management strategies in combination with metformin .  - Monitor weight and consider future interventions.  General Health Maintenance Discussed routine health maintenance. - Advised to maintain hydration. - Encouraged continued weight management efforts.   Jayce Boyko, PA-C  "

## 2024-11-14 LAB — IRON,TIBC AND FERRITIN PANEL
Ferritin: 41 ng/mL (ref 15–150)
Iron Saturation: 10 % — ABNORMAL LOW (ref 15–55)
Iron: 39 ug/dL (ref 27–159)
Total Iron Binding Capacity: 379 ug/dL (ref 250–450)
UIBC: 340 ug/dL (ref 131–425)

## 2024-11-14 LAB — CMP14+EGFR
ALT: 12 IU/L (ref 0–32)
AST: 17 IU/L (ref 0–40)
Albumin: 4.6 g/dL (ref 4.0–5.0)
Alkaline Phosphatase: 74 IU/L (ref 41–116)
BUN/Creatinine Ratio: 11 (ref 9–23)
BUN: 7 mg/dL (ref 6–20)
Bilirubin Total: 0.5 mg/dL (ref 0.0–1.2)
CO2: 19 mmol/L — ABNORMAL LOW (ref 20–29)
Calcium: 9.8 mg/dL (ref 8.7–10.2)
Chloride: 104 mmol/L (ref 96–106)
Creatinine, Ser: 0.63 mg/dL (ref 0.57–1.00)
Globulin, Total: 2.4 g/dL (ref 1.5–4.5)
Glucose: 65 mg/dL — ABNORMAL LOW (ref 70–99)
Potassium: 4.4 mmol/L (ref 3.5–5.2)
Sodium: 140 mmol/L (ref 134–144)
Total Protein: 7 g/dL (ref 6.0–8.5)
eGFR: 127 mL/min/1.73

## 2024-11-14 LAB — LIPID PANEL
Chol/HDL Ratio: 2.3 ratio (ref 0.0–4.4)
Cholesterol, Total: 137 mg/dL (ref 100–199)
HDL: 60 mg/dL
LDL Chol Calc (NIH): 68 mg/dL (ref 0–99)
Triglycerides: 33 mg/dL (ref 0–149)
VLDL Cholesterol Cal: 9 mg/dL (ref 5–40)

## 2024-11-14 LAB — TSH+FREE T4
Free T4: 1.1 ng/dL (ref 0.82–1.77)
TSH: 1.68 u[IU]/mL (ref 0.450–4.500)

## 2024-11-14 LAB — HEMOGLOBIN A1C
Est. average glucose Bld gHb Est-mCnc: 108 mg/dL
Hgb A1c MFr Bld: 5.4 % (ref 4.8–5.6)

## 2024-11-14 LAB — VITAMIN D 25 HYDROXY (VIT D DEFICIENCY, FRACTURES): Vit D, 25-Hydroxy: 28.5 ng/mL — ABNORMAL LOW (ref 30.0–100.0)

## 2024-11-14 LAB — DHEA-SULFATE: DHEA-SO4: 509 ug/dL — ABNORMAL HIGH (ref 110.0–431.7)

## 2024-11-14 LAB — TESTOSTERONE: Testosterone: 82 ng/dL — ABNORMAL HIGH (ref 13–71)

## 2024-11-15 ENCOUNTER — Ambulatory Visit: Payer: Self-pay | Admitting: Physician Assistant

## 2024-11-15 NOTE — Progress Notes (Signed)
 Alison Holden,   Testosterone  and DHEA still elevated. Spiroloactone can help decrease these numbers. I had sent it before. Any reason why not taking?   Cholesterol looks GREAT A1C normal.  Thyroid normal.   Vitamin D  not to goal. Increase by 1000 units a day and take with dairy for better absorption.   Iron saturation still low. Are you taking any oral iron?
